# Patient Record
Sex: Male | Born: 1980 | Race: Black or African American | Hispanic: No | Smoking: Current every day smoker
Health system: Southern US, Community
[De-identification: ages and names within clinical notes are randomized; demographics above are authoritative.]

## PROBLEM LIST (undated history)

## (undated) DIAGNOSIS — F419 Anxiety disorder, unspecified: Secondary | ICD-10-CM

## (undated) DIAGNOSIS — R1032 Left lower quadrant pain: Secondary | ICD-10-CM

## (undated) DIAGNOSIS — M549 Dorsalgia, unspecified: Secondary | ICD-10-CM

## (undated) DIAGNOSIS — Z8781 Personal history of (healed) traumatic fracture: Secondary | ICD-10-CM

## (undated) DIAGNOSIS — J45909 Unspecified asthma, uncomplicated: Secondary | ICD-10-CM

## (undated) DIAGNOSIS — E119 Type 2 diabetes mellitus without complications: Secondary | ICD-10-CM

## (undated) DIAGNOSIS — M5442 Lumbago with sciatica, left side: Secondary | ICD-10-CM

## (undated) DIAGNOSIS — R2689 Other abnormalities of gait and mobility: Secondary | ICD-10-CM

## (undated) DIAGNOSIS — M79606 Pain in leg, unspecified: Secondary | ICD-10-CM

## (undated) DIAGNOSIS — F32A Depression, unspecified: Secondary | ICD-10-CM

## (undated) DIAGNOSIS — M1990 Unspecified osteoarthritis, unspecified site: Secondary | ICD-10-CM

## (undated) DIAGNOSIS — J349 Unspecified disorder of nose and nasal sinuses: Secondary | ICD-10-CM

## (undated) HISTORY — DX: Unspecified disorder of nose and nasal sinuses: J34.9

## (undated) HISTORY — DX: Pain in leg, unspecified: M79.606

## (undated) HISTORY — DX: Lumbago with sciatica, left side: M54.42

## (undated) HISTORY — DX: Anxiety disorder, unspecified: F41.9

## (undated) HISTORY — PX: HERNIA REPAIR: SHX51

## (undated) HISTORY — DX: Other abnormalities of gait and mobility: R26.89

## (undated) HISTORY — DX: Left lower quadrant pain: R10.32

## (undated) HISTORY — DX: Dorsalgia, unspecified: M54.9

## (undated) HISTORY — DX: Depression, unspecified: F32.A

## (undated) HISTORY — PX: ANTERIOR CRUCIATE LIGAMENT REPAIR: SHX115

## (undated) HISTORY — DX: Personal history of (healed) traumatic fracture: Z87.81

---

## 2019-04-18 DIAGNOSIS — Y990 Civilian activity done for income or pay: Secondary | ICD-10-CM

## 2019-04-18 HISTORY — DX: Civilian activity done for income or pay: Y99.0

## 2020-11-19 ENCOUNTER — Other Ambulatory Visit: Payer: Self-pay

## 2020-11-19 ENCOUNTER — Other Ambulatory Visit: Payer: Self-pay | Admitting: Neurological Surgery

## 2020-12-15 ENCOUNTER — Ambulatory Visit (INDEPENDENT_AMBULATORY_CARE_PROVIDER_SITE_OTHER): Payer: Worker's Compensation | Admitting: Vascular Surgery

## 2020-12-15 ENCOUNTER — Other Ambulatory Visit: Payer: Self-pay

## 2020-12-15 ENCOUNTER — Encounter: Payer: Self-pay | Admitting: Vascular Surgery

## 2020-12-15 VITALS — BP 138/96 | HR 73 | Temp 98.4°F | Resp 20 | Ht 69.0 in | Wt 245.0 lb

## 2020-12-15 DIAGNOSIS — M5136 Other intervertebral disc degeneration, lumbar region: Secondary | ICD-10-CM | POA: Diagnosis not present

## 2020-12-15 NOTE — Progress Notes (Signed)
 REASON FOR CONSULT:    To evaluate for anterior retroperitoneal exposure of L4-L5.  The consult is requested by Dr. Elsner.  ASSESSMENT & PLAN:   DEGENERATIVE DISC DISEASE L4-L5: This patient appears to be a good candidate for anterior retroperitoneal exposure of L4-L5.  I do not see any complicating factors on his CT or MRI.  I have reviewed our role in exposure of the spine in order to allow anterior lumbar interbody fusion at the appropriate levels. We have discussed the potential complications of surgery, including but not limited to, arterial or venous injury, thrombosis, or bleeding. We have also discussed the potential risks of wound healing problems, the development of a hernia, nerve injury, leg swelling, or other unpredictable medical problems. His BMI is 36 which does put him at slightly higher risk for complications.  All the patient's questions were answered and they are agreeable to proceed.  He is scheduled for surgery on 12/28/2020.  [name], MD Office: [phone]   HPI:   Clinton Horton is a pleasant 40 y.o. male, who was crushed by a forklift in 2020 and sustained multiple injuries including injuries to his back and pelvis.  He also had abdominal hernias which were repaired.  He has had continued problems with his back and has been evaluated by Dr. Elsner who felt he was a good candidate for anterior lumbar interbody fusion.  I have reviewed the records from Dr. Elsner's office.  The patient has evidence of spondylolisthesis at L4-L5 with central disc protrusion.  There was no nerve root compromise noted.  Has had pain for well over a year and a half and therefore it was felt that surgery was indicated.  The patient's risk factors for peripheral vascular disease include tobacco use.  He smokes half a pack per day.  He denies any history of diabetes, hypertension, hypercholesterolemia, or family history of premature cardiovascular disease.  Past Medical History:   Diagnosis Date  . Anxiety   . Back pain   . Balance disorder   . Depression   . Healed fracture    With Callus Formation  . Leg pain    While Walking  . LLQ pain   . Lumbago with sciatica, left side   . Sinus disorder   . Work related injury 04/18/2019    History reviewed. No pertinent family history.  SOCIAL HISTORY: Social History   Socioeconomic History  . Marital status: Divorced    Spouse name: Not on file  . Number of children: Not on file  . Years of education: Not on file  . Highest education level: Not on file  Occupational History  . Occupation: DISTRIBUTION  Tobacco Use  . Smoking status: Current Every Day Smoker    Packs/day: 0.50    Years: 19.00    Pack years: 9.50    Types: Cigarettes  . Smokeless tobacco: Never Used  Vaping Use  . Vaping Use: Never used  Substance and Sexual Activity  . Alcohol use: Yes    Comment: occasional  . Drug use: Not on file  . Sexual activity: Not on file  Other Topics Concern  . Not on file  Social History Narrative  . Not on file   Social Determinants of Health   Financial Resource Strain: Not on file  Food Insecurity: Not on file  Transportation Needs: Not on file  Physical Activity: Not on file  Stress: Not on file  Social Connections: Not on file  Intimate Partner Violence: Not on   file    Allergies  Allergen Reactions  . Other     Steroids cause elevated Blood Sugar    No current outpatient medications on file.   No current facility-administered medications for this visit.    REVIEW OF SYSTEMS:  [X]  denotes positive finding, [ ]  denotes negative finding Cardiac  Comments:  Chest pain or chest pressure:    Shortness of breath upon exertion:    Short of breath when lying flat:    Irregular heart rhythm:        Vascular    Pain in calf, thigh, or hip brought on by ambulation:    Pain in feet at night that wakes you up from your sleep:     Blood clot in your veins:    Leg swelling:          Pulmonary    Oxygen at home:    Productive cough:     Wheezing:         Neurologic    Sudden weakness in arms or legs:     Sudden numbness in arms or legs:     Sudden onset of difficulty speaking or slurred speech:    Temporary loss of vision in one eye:     Problems with dizziness:         Gastrointestinal    Blood in stool:     Vomited blood:         Genitourinary    Burning when urinating:     Blood in urine:        Psychiatric    Major depression:         Hematologic    Bleeding problems:    Problems with blood clotting too easily:        Skin    Rashes or ulcers:        Constitutional    Fever or chills:     PHYSICAL EXAM:   Vitals:   12/15/20 0901  BP: (!) 138/96  Pulse: 73  Resp: 20  Temp: 98.4 F (36.9 C)  SpO2: 97%  Weight: 245 lb (111.1 kg)  Height: 5\' 9"  (1.753 m)   Body mass index is 36.18 kg/m.  GENERAL: The patient is a well-nourished male, in no acute distress. The vital signs are documented above. CARDIAC: There is a regular rate and rhythm.  VASCULAR: I do not detect carotid bruits. He has palpable dorsalis pedis and posterior tibial positions bilaterally. PULMONARY: There is good air exchange bilaterally without wheezing or rales. ABDOMEN: Soft and non-tender with normal pitched bowel sounds.  MUSCULOSKELETAL: There are no major deformities or cyanosis. NEUROLOGIC: No focal weakness or paresthesias are detected. SKIN: There are no ulcers or rashes noted. PSYCHIATRIC: The patient has a normal affect.  DATA:    I did review his CT scan and MRI that were sent with the patient.  I do not see any complicating features from a vascular standpoint.

## 2020-12-15 NOTE — H&P (View-Only) (Signed)
REASON FOR CONSULT:    To evaluate for anterior retroperitoneal exposure of L4-L5.  The consult is requested by Dr. Danielle Dess.  ASSESSMENT & PLAN:   DEGENERATIVE DISC DISEASE L4-L5: This patient appears to be a good candidate for anterior retroperitoneal exposure of L4-L5.  I do not see any complicating factors on his CT or MRI.  I have reviewed our role in exposure of the spine in order to allow anterior lumbar interbody fusion at the appropriate levels. We have discussed the potential complications of surgery, including but not limited to, arterial or venous injury, thrombosis, or bleeding. We have also discussed the potential risks of wound healing problems, the development of a hernia, nerve injury, leg swelling, or other unpredictable medical problems. His BMI is 36 which does put him at slightly higher risk for complications.  All the patient's questions were answered and they are agreeable to proceed.  He is scheduled for surgery on 12/28/2020.  [name], MD Office: [phone]   HPI:   Clinton Horton is a pleasant 40 y.o. male, who was crushed by a forklift in 2020 and sustained multiple injuries including injuries to his back and pelvis.  He also had abdominal hernias which were repaired.  He has had continued problems with his back and has been evaluated by Dr. Danielle Dess who felt he was a good candidate for anterior lumbar interbody fusion.  I have reviewed the records from Dr. Verlee Rossetti office.  The patient has evidence of spondylolisthesis at L4-L5 with central disc protrusion.  There was no nerve root compromise noted.  Has had pain for well over a year and a half and therefore it was felt that surgery was indicated.  The patient's risk factors for peripheral vascular disease include tobacco use.  He smokes half a pack per day.  He denies any history of diabetes, hypertension, hypercholesterolemia, or family history of premature cardiovascular disease.  Past Medical History:   Diagnosis Date  . Anxiety   . Back pain   . Balance disorder   . Depression   . Healed fracture    With Callus Formation  . Leg pain    While Walking  . LLQ pain   . Lumbago with sciatica, left side   . Sinus disorder   . Work related injury 04/18/2019    History reviewed. No pertinent family history.  SOCIAL HISTORY: Social History   Socioeconomic History  . Marital status: Divorced    Spouse name: Not on file  . Number of children: Not on file  . Years of education: Not on file  . Highest education level: Not on file  Occupational History  . Occupation: DISTRIBUTION  Tobacco Use  . Smoking status: Current Every Day Smoker    Packs/day: 0.50    Years: 19.00    Pack years: 9.50    Types: Cigarettes  . Smokeless tobacco: Never Used  Vaping Use  . Vaping Use: Never used  Substance and Sexual Activity  . Alcohol use: Yes    Comment: occasional  . Drug use: Not on file  . Sexual activity: Not on file  Other Topics Concern  . Not on file  Social History Narrative  . Not on file   Social Determinants of Health   Financial Resource Strain: Not on file  Food Insecurity: Not on file  Transportation Needs: Not on file  Physical Activity: Not on file  Stress: Not on file  Social Connections: Not on file  Intimate Partner Violence: Not on  file    Allergies  Allergen Reactions  . Other     Steroids cause elevated Blood Sugar    No current outpatient medications on file.   No current facility-administered medications for this visit.    REVIEW OF SYSTEMS:  [X]  denotes positive finding, [ ]  denotes negative finding Cardiac  Comments:  Chest pain or chest pressure:    Shortness of breath upon exertion:    Short of breath when lying flat:    Irregular heart rhythm:        Vascular    Pain in calf, thigh, or hip brought on by ambulation:    Pain in feet at night that wakes you up from your sleep:     Blood clot in your veins:    Leg swelling:          Pulmonary    Oxygen at home:    Productive cough:     Wheezing:         Neurologic    Sudden weakness in arms or legs:     Sudden numbness in arms or legs:     Sudden onset of difficulty speaking or slurred speech:    Temporary loss of vision in one eye:     Problems with dizziness:         Gastrointestinal    Blood in stool:     Vomited blood:         Genitourinary    Burning when urinating:     Blood in urine:        Psychiatric    Major depression:         Hematologic    Bleeding problems:    Problems with blood clotting too easily:        Skin    Rashes or ulcers:        Constitutional    Fever or chills:     PHYSICAL EXAM:   Vitals:   12/15/20 0901  BP: (!) 138/96  Pulse: 73  Resp: 20  Temp: 98.4 F (36.9 C)  SpO2: 97%  Weight: 245 lb (111.1 kg)  Height: 5\' 9"  (1.753 m)   Body mass index is 36.18 kg/m.  GENERAL: The patient is a well-nourished male, in no acute distress. The vital signs are documented above. CARDIAC: There is a regular rate and rhythm.  VASCULAR: I do not detect carotid bruits. He has palpable dorsalis pedis and posterior tibial positions bilaterally. PULMONARY: There is good air exchange bilaterally without wheezing or rales. ABDOMEN: Soft and non-tender with normal pitched bowel sounds.  MUSCULOSKELETAL: There are no major deformities or cyanosis. NEUROLOGIC: No focal weakness or paresthesias are detected. SKIN: There are no ulcers or rashes noted. PSYCHIATRIC: The patient has a normal affect.  DATA:    I did review his CT scan and MRI that were sent with the patient.  I do not see any complicating features from a vascular standpoint.

## 2020-12-23 NOTE — Progress Notes (Signed)
Surgical Instructions    Your procedure is scheduled on Tuesday April 19th.  Report to [facility] Main Entrance "A" at 5:30 A.M., then check in with the Admitting office.  Call this number if you have problems the morning of surgery:  [phone]   If you have any questions prior to your surgery date call [phone]: Open Monday-Friday 8am-4pm    Remember:  Do not eat or drink after midnight the night before your surgery     There are no medications you need to take the morning of surgery.  As of today, STOP taking any Aspirin (unless otherwise instructed by your surgeon) Aleve, Naproxen, Ibuprofen, Motrin, Advil, Goody's, BC's, all herbal medications, fish oil, and all vitamins.                     Do not wear jewelry.            Do not wear lotions, powders,colognes, or deodorant.            Do not shave 48 hours prior to surgery.  Men may shave face and neck.            Do not bring valuables to the hospital.            [facility] is not responsible for any belongings or valuables.  Do NOT Smoke (Tobacco/Vaping) or drink Alcohol 24 hours prior to your procedure If you use a CPAP at night, you may bring all equipment for your overnight stay.   Contacts, glasses, dentures or partials may not be worn into surgery, please bring cases for these belongings   For patients admitted to the hospital, discharge time will be determined by your treatment team.   Patients discharged the day of surgery will not be allowed to drive home, and someone needs to stay with them for 24 hours.    Special instructions:   [facility]- Preparing For Surgery  Before surgery, you can play an important role. Because skin is not sterile, your skin needs to be as free of germs as possible. You can reduce the number of germs on your skin by washing with CHG (chlorahexidine gluconate) Soap before surgery.  CHG is an antiseptic cleaner which kills germs and bonds with the skin to continue killing germs  even after washing.    Oral Hygiene is also important to reduce your risk of infection.  Remember - BRUSH YOUR TEETH THE MORNING OF SURGERY WITH YOUR REGULAR TOOTHPASTE  Please do not use if you have an allergy to CHG or antibacterial soaps. If your skin becomes reddened/irritated stop using the CHG.  Do not shave (including legs and underarms) for at least 48 hours prior to first CHG shower. It is OK to shave your face.  Please follow these instructions carefully.   1. Shower the NIGHT BEFORE SURGERY and the MORNING OF SURGERY  2. If you chose to wash your hair, wash your hair first as usual with your normal shampoo.  3. After you shampoo, rinse your hair and body thoroughly to remove the shampoo.   4. Use CHG Soap as you would any other liquid soap. You can apply CHG directly to the skin and wash gently with a scrungie or a clean washcloth.   5. Apply the CHG Soap to your body ONLY FROM THE NECK DOWN.  Do not use on open wounds or open sores. Avoid contact with your eyes, ears, mouth and genitals (private parts). Wash Face and  genitals (private parts)  with your normal soap.   6. Wash thoroughly, paying special attention to the area where your surgery will be performed.  7. Thoroughly rinse your body with warm water from the neck down.  8. DO NOT shower/wash with your normal soap after using and rinsing off the CHG Soap.  9. Pat yourself dry with a CLEAN TOWEL.  10. Wear CLEAN PAJAMAS to bed the night before surgery  11. Place CLEAN SHEETS on your bed the night before your surgery  12. DO NOT SLEEP WITH PETS.   Day of Surgery: Take a shower with CHG soap. Wear Clean/Comfortable clothing the morning of surgery Do not apply any deodorants/lotions.   Remember to brush your teeth WITH YOUR REGULAR TOOTHPASTE.   Please read over the following fact sheets that you were given.

## 2020-12-24 ENCOUNTER — Encounter (HOSPITAL_COMMUNITY)
Admission: RE | Admit: 2020-12-24 | Discharge: 2020-12-24 | Disposition: A | Discharge status: Home / Self Care | Payer: No Typology Code available for payment source | Source: Ambulatory Visit | Attending: Neurological Surgery | Admitting: Neurological Surgery

## 2020-12-24 ENCOUNTER — Encounter (HOSPITAL_COMMUNITY): Payer: Self-pay

## 2020-12-24 ENCOUNTER — Other Ambulatory Visit: Payer: Self-pay

## 2020-12-24 DIAGNOSIS — Z01812 Encounter for preprocedural laboratory examination: Secondary | ICD-10-CM | POA: Diagnosis not present

## 2020-12-24 HISTORY — DX: Unspecified asthma, uncomplicated: J45.909

## 2020-12-24 HISTORY — DX: Type 2 diabetes mellitus without complications: E11.9

## 2020-12-24 HISTORY — DX: Unspecified osteoarthritis, unspecified site: M19.90

## 2020-12-24 LAB — BASIC METABOLIC PANEL
Anion gap: 6 (ref 5–15)
BUN: 8 mg/dL (ref 6–20)
CO2: 28 mmol/L (ref 22–32)
Calcium: 9.7 mg/dL (ref 8.9–10.3)
Chloride: 101 mmol/L (ref 98–111)
Creatinine, Ser: 0.87 mg/dL (ref 0.61–1.24)
GFR, Estimated: >60 mL/min (ref >60)
Glucose, Bld: 131 mg/dL — ABNORMAL HIGH (ref 70–99)
Potassium: 3.9 mmol/L (ref 3.5–5.1)
Sodium: 135 mmol/L (ref 135–145)

## 2020-12-24 LAB — CBC
HCT: 45.9 % (ref 39.0–52.0)
Hemoglobin: 15.4 g/dL (ref 13.0–17.0)
MCH: 29.1 pg (ref 26.0–34.0)
MCHC: 33.6 g/dL (ref 30.0–36.0)
MCV: 86.8 fL (ref 80.0–100.0)
Platelets: 247 10*3/uL (ref 150–400)
RBC: 5.29 MIL/uL (ref 4.22–5.81)
RDW: 13.4 % (ref 11.5–15.5)
WBC: 4.4 10*3/uL (ref 4.0–10.5)
nRBC: 0 % (ref 0.0–0.2)

## 2020-12-24 LAB — TYPE AND SCREEN
ABO/RH(D): A POS
Antibody Screen: NEGATIVE

## 2020-12-24 LAB — SURGICAL PCR SCREEN
MRSA, PCR: NEGATIVE
Staphylococcus aureus: NEGATIVE

## 2020-12-24 LAB — HEMOGLOBIN A1C
Hgb A1c MFr Bld: 6.7 % — ABNORMAL HIGH (ref 4.8–5.6)
Mean Plasma Glucose: 145.59 mg/dL

## 2020-12-24 LAB — GLUCOSE, CAPILLARY: Glucose-Capillary: 148 mg/dL — ABNORMAL HIGH (ref 70–99)

## 2020-12-24 NOTE — Progress Notes (Signed)
PCP: Denies Cardiologist: Denies  EKG: 06/26/21. Requested EKG tracing CXR: na ECHO: denies Stress Test: denies Cardiac Cath: denies  Fasting Blood Sugar- patient denies DM Checks Blood Sugar__0_ times a day  OSA/CPAP: No  ASA/Blood Thinners: No  covid test 12/24/20 at PAT  Anesthesia Review: No  Patient denies shortness of breath, fever, cough, and chest pain at PAT appointment.  Patient verbalized understanding of instructions provided today at the PAT appointment.  Patient asked to review instructions at home and day of surgery.

## 2020-12-27 NOTE — Anesthesia Preprocedure Evaluation (Addendum)
Anesthesia Evaluation  Patient identified by MRN, date of birth, ID band Patient awake    Reviewed: Allergy & Precautions, NPO status , Patient's Chart, lab work & pertinent test results  Airway Mallampati: II  TM Distance: >3 FB     Dental   Pulmonary asthma , Current Smoker and Patient abstained from smoking.,    breath sounds clear to auscultation       Cardiovascular negative cardio ROS   Rhythm:Regular Rate:Normal     Neuro/Psych PSYCHIATRIC DISORDERS Anxiety Depression  Neuromuscular disease    GI/Hepatic negative GI ROS, Neg liver ROS,   Endo/Other  diabetes  Renal/GU negative Renal ROS     Musculoskeletal   Abdominal   Peds  Hematology   Anesthesia Other Findings   Reproductive/Obstetrics                            Anesthesia Physical Anesthesia Plan  ASA: III  Anesthesia Plan: General   Post-op Pain Management:    Induction: Intravenous  PONV Risk Score and Plan: 2 and Ondansetron, Dexamethasone and Midazolam  Airway Management Planned: Oral ETT  Additional Equipment: Arterial line  Intra-op Plan:   Post-operative Plan: Possible Post-op intubation/ventilation  Informed Consent: I have reviewed the patients History and Physical, chart, labs and discussed the procedure including the risks, benefits and alternatives for the proposed anesthesia with the patient or authorized representative who has indicated his/her understanding and acceptance.     Dental advisory given  Plan Discussed with: Anesthesiologist and CRNA  Anesthesia Plan Comments:       Anesthesia Quick Evaluation

## 2020-12-28 ENCOUNTER — Other Ambulatory Visit: Payer: Self-pay

## 2020-12-28 ENCOUNTER — Inpatient Hospital Stay (HOSPITAL_COMMUNITY): Payer: No Typology Code available for payment source

## 2020-12-28 ENCOUNTER — Encounter (HOSPITAL_COMMUNITY): Payer: Self-pay | Admitting: Neurological Surgery

## 2020-12-28 ENCOUNTER — Inpatient Hospital Stay (HOSPITAL_COMMUNITY): Payer: No Typology Code available for payment source | Admitting: Vascular Surgery

## 2020-12-28 ENCOUNTER — Encounter (HOSPITAL_COMMUNITY)
Admission: RE | Disposition: A | Discharge status: Home / Self Care | Payer: Self-pay | Source: Home / Self Care | Attending: Neurological Surgery

## 2020-12-28 ENCOUNTER — Inpatient Hospital Stay (HOSPITAL_COMMUNITY)
Admission: RE | Admit: 2020-12-28 | Discharge: 2020-12-29 | DRG: 460 | Disposition: A | Discharge status: Home / Self Care | Payer: No Typology Code available for payment source | Attending: Neurological Surgery | Admitting: Neurological Surgery

## 2020-12-28 ENCOUNTER — Inpatient Hospital Stay (HOSPITAL_COMMUNITY): Payer: No Typology Code available for payment source | Admitting: Certified Registered"

## 2020-12-28 DIAGNOSIS — M4316 Spondylolisthesis, lumbar region: Secondary | ICD-10-CM | POA: Diagnosis present

## 2020-12-28 DIAGNOSIS — Z20822 Contact with and (suspected) exposure to covid-19: Secondary | ICD-10-CM | POA: Diagnosis present

## 2020-12-28 DIAGNOSIS — F419 Anxiety disorder, unspecified: Secondary | ICD-10-CM | POA: Diagnosis present

## 2020-12-28 DIAGNOSIS — Z419 Encounter for procedure for purposes other than remedying health state, unspecified: Secondary | ICD-10-CM

## 2020-12-28 DIAGNOSIS — E119 Type 2 diabetes mellitus without complications: Secondary | ICD-10-CM | POA: Diagnosis present

## 2020-12-28 DIAGNOSIS — F32A Depression, unspecified: Secondary | ICD-10-CM | POA: Diagnosis present

## 2020-12-28 DIAGNOSIS — F17210 Nicotine dependence, cigarettes, uncomplicated: Secondary | ICD-10-CM | POA: Diagnosis present

## 2020-12-28 DIAGNOSIS — Z7984 Long term (current) use of oral hypoglycemic drugs: Secondary | ICD-10-CM | POA: Diagnosis not present

## 2020-12-28 DIAGNOSIS — M5416 Radiculopathy, lumbar region: Secondary | ICD-10-CM | POA: Diagnosis not present

## 2020-12-28 DIAGNOSIS — M5116 Intervertebral disc disorders with radiculopathy, lumbar region: Secondary | ICD-10-CM | POA: Diagnosis present

## 2020-12-28 DIAGNOSIS — Z888 Allergy status to other drugs, medicaments and biological substances status: Secondary | ICD-10-CM | POA: Diagnosis not present

## 2020-12-28 HISTORY — PX: ANTERIOR LUMBAR FUSION: SHX1170

## 2020-12-28 HISTORY — PX: ABDOMINAL EXPOSURE: SHX5708

## 2020-12-28 LAB — GLUCOSE, CAPILLARY
Glucose-Capillary: 110 mg/dL — ABNORMAL HIGH (ref 70–99)
Glucose-Capillary: 130 mg/dL — ABNORMAL HIGH (ref 70–99)
Glucose-Capillary: 149 mg/dL — ABNORMAL HIGH (ref 70–99)
Glucose-Capillary: 106 mg/dL — ABNORMAL HIGH (ref 70–99)

## 2020-12-28 LAB — ABO/RH: ABO/RH(D): A POS

## 2020-12-28 LAB — SARS CORONAVIRUS 2 BY RT PCR (HOSPITAL ORDER, PERFORMED IN ~~LOC~~ HOSPITAL LAB): SARS Coronavirus 2: NEGATIVE

## 2020-12-28 SURGERY — ANTERIOR LUMBAR FUSION 1 LEVEL
Anesthesia: General

## 2020-12-28 MED ORDER — SUGAMMADEX SODIUM 200 MG/2ML IV SOLN
INTRAVENOUS | Status: DC | PRN
  Administered 2020-12-28: 300 mg via INTRAVENOUS

## 2020-12-28 MED ORDER — ROCURONIUM BROMIDE 10 MG/ML (PF) SYRINGE
PREFILLED_SYRINGE | INTRAVENOUS | Status: AC
  Filled 2020-12-28: qty 10

## 2020-12-28 MED ORDER — FENTANYL CITRATE (PF) 100 MCG/2ML IJ SOLN: 25.0000 ug | INTRAMUSCULAR | Status: DC | PRN

## 2020-12-28 MED ORDER — PROPOFOL 10 MG/ML IV BOLUS
INTRAVENOUS | Status: DC | PRN
  Administered 2020-12-28: 150 mg via INTRAVENOUS

## 2020-12-28 MED ORDER — ONDANSETRON HCL 4 MG PO TABS: 4.0000 mg | ORAL_TABLET | Freq: Four times a day (QID) | ORAL | Status: DC | PRN

## 2020-12-28 MED ORDER — MIDAZOLAM HCL 5 MG/5ML IJ SOLN
INTRAMUSCULAR | Status: DC | PRN
  Administered 2020-12-28: 2 mg via INTRAVENOUS

## 2020-12-28 MED ORDER — LACTATED RINGERS IV SOLN: INTRAVENOUS | Status: DC

## 2020-12-28 MED ORDER — SODIUM CHLORIDE 0.9% FLUSH: 3.0000 mL | INTRAVENOUS | Status: DC | PRN

## 2020-12-28 MED ORDER — LIDOCAINE-EPINEPHRINE 1 %-1:100000 IJ SOLN
INTRAMUSCULAR | Status: AC
  Filled 2020-12-28: qty 1

## 2020-12-28 MED ORDER — SENNA 8.6 MG PO TABS
1.0000 | ORAL_TABLET | Freq: Two times a day (BID) | ORAL | Status: DC
  Administered 2020-12-28 (×2): 8.6 mg via ORAL
  Filled 2020-12-28 (×2): qty 1

## 2020-12-28 MED ORDER — DOCUSATE SODIUM 100 MG PO CAPS
100.0000 mg | ORAL_CAPSULE | Freq: Two times a day (BID) | ORAL | Status: DC
  Administered 2020-12-28 (×2): 100 mg via ORAL
  Filled 2020-12-28 (×2): qty 1

## 2020-12-28 MED ORDER — MENTHOL 3 MG MT LOZG: 1.0000 | LOZENGE | OROMUCOSAL | Status: DC | PRN

## 2020-12-28 MED ORDER — METHOCARBAMOL 1000 MG/10ML IJ SOLN
500.0000 mg | Freq: Four times a day (QID) | INTRAVENOUS | Status: DC | PRN
  Filled 2020-12-28: qty 5

## 2020-12-28 MED ORDER — SUCCINYLCHOLINE CHLORIDE 200 MG/10ML IV SOSY
PREFILLED_SYRINGE | INTRAVENOUS | Status: AC
  Filled 2020-12-28: qty 10

## 2020-12-28 MED ORDER — LIDOCAINE 2% (20 MG/ML) 5 ML SYRINGE
INTRAMUSCULAR | Status: AC
  Filled 2020-12-28: qty 5

## 2020-12-28 MED ORDER — CHLORHEXIDINE GLUCONATE CLOTH 2 % EX PADS: 6.0000 | MEDICATED_PAD | Freq: Once | CUTANEOUS | Status: DC

## 2020-12-28 MED ORDER — CEFAZOLIN SODIUM-DEXTROSE 2-4 GM/100ML-% IV SOLN
2.0000 g | INTRAVENOUS | Status: AC
  Administered 2020-12-28: 2 g via INTRAVENOUS
  Filled 2020-12-28: qty 100

## 2020-12-28 MED ORDER — FENTANYL CITRATE (PF) 250 MCG/5ML IJ SOLN
INTRAMUSCULAR | Status: AC
  Filled 2020-12-28: qty 5

## 2020-12-28 MED ORDER — PHENYLEPHRINE 40 MCG/ML (10ML) SYRINGE FOR IV PUSH (FOR BLOOD PRESSURE SUPPORT)
PREFILLED_SYRINGE | INTRAVENOUS | Status: AC
  Filled 2020-12-28: qty 10

## 2020-12-28 MED ORDER — ADULT MULTIVITAMIN W/MINERALS CH
1.0000 | ORAL_TABLET | Freq: Every day | ORAL | Status: DC
  Administered 2020-12-28: 1 via ORAL
  Filled 2020-12-28: qty 1

## 2020-12-28 MED ORDER — METHOCARBAMOL 500 MG PO TABS
500.0000 mg | ORAL_TABLET | Freq: Four times a day (QID) | ORAL | Status: DC | PRN
  Administered 2020-12-28 – 2020-12-29 (×3): 500 mg via ORAL
  Filled 2020-12-28 (×3): qty 1

## 2020-12-28 MED ORDER — THROMBIN 20000 UNITS EX SOLR
CUTANEOUS | Status: DC | PRN
  Administered 2020-12-28: 20 mL via TOPICAL

## 2020-12-28 MED ORDER — THROMBIN 5000 UNITS EX SOLR
CUTANEOUS | Status: AC
  Filled 2020-12-28: qty 5000

## 2020-12-28 MED ORDER — LIDOCAINE 2% (20 MG/ML) 5 ML SYRINGE
INTRAMUSCULAR | Status: DC | PRN
  Administered 2020-12-28: 80 mg via INTRAVENOUS

## 2020-12-28 MED ORDER — ACETAMINOPHEN 325 MG PO TABS: 650.0000 mg | ORAL_TABLET | ORAL | Status: DC | PRN

## 2020-12-28 MED ORDER — PHENOL 1.4 % MT LIQD: 1.0000 | OROMUCOSAL | Status: DC | PRN

## 2020-12-28 MED ORDER — PHENYLEPHRINE HCL-NACL 10-0.9 MG/250ML-% IV SOLN
INTRAVENOUS | Status: DC | PRN
  Administered 2020-12-28: 20 ug/min via INTRAVENOUS

## 2020-12-28 MED ORDER — SODIUM CHLORIDE 0.9 % IV SOLN: 250.0000 mL | INTRAVENOUS | Status: DC

## 2020-12-28 MED ORDER — ROCURONIUM BROMIDE 10 MG/ML (PF) SYRINGE
PREFILLED_SYRINGE | INTRAVENOUS | Status: DC | PRN
  Administered 2020-12-28: 20 mg via INTRAVENOUS
  Administered 2020-12-28: 60 mg via INTRAVENOUS
  Administered 2020-12-28: 40 mg via INTRAVENOUS
  Administered 2020-12-28: 20 mg via INTRAVENOUS

## 2020-12-28 MED ORDER — BISACODYL 10 MG RE SUPP: 10.0000 mg | Freq: Every day | RECTAL | Status: DC | PRN

## 2020-12-28 MED ORDER — ORAL CARE MOUTH RINSE: 15.0000 mL | Freq: Once | OROMUCOSAL | Status: AC

## 2020-12-28 MED ORDER — INSULIN ASPART 100 UNIT/ML ~~LOC~~ SOLN
0.0000 [IU] | Freq: Three times a day (TID) | SUBCUTANEOUS | Status: DC
  Administered 2020-12-28 – 2020-12-29 (×2): 2 [IU] via SUBCUTANEOUS

## 2020-12-28 MED ORDER — CHLORHEXIDINE GLUCONATE 0.12 % MT SOLN
15.0000 mL | Freq: Once | OROMUCOSAL | Status: AC
  Administered 2020-12-28: 15 mL via OROMUCOSAL
  Filled 2020-12-28: qty 15

## 2020-12-28 MED ORDER — ALUM & MAG HYDROXIDE-SIMETH 200-200-20 MG/5ML PO SUSP: 30.0000 mL | Freq: Four times a day (QID) | ORAL | Status: DC | PRN

## 2020-12-28 MED ORDER — CHLORHEXIDINE GLUCONATE 4 % EX LIQD: 60.0000 mL | Freq: Once | CUTANEOUS | Status: DC

## 2020-12-28 MED ORDER — DEXMEDETOMIDINE HCL 200 MCG/2ML IV SOLN
INTRAVENOUS | Status: DC | PRN
  Administered 2020-12-28: 8 ug via INTRAVENOUS

## 2020-12-28 MED ORDER — ACETAMINOPHEN 650 MG RE SUPP: 650.0000 mg | RECTAL | Status: DC | PRN

## 2020-12-28 MED ORDER — CEFAZOLIN SODIUM-DEXTROSE 2-4 GM/100ML-% IV SOLN
2.0000 g | Freq: Three times a day (TID) | INTRAVENOUS | Status: AC
  Administered 2020-12-28 (×2): 2 g via INTRAVENOUS
  Filled 2020-12-28 (×2): qty 100

## 2020-12-28 MED ORDER — FLEET ENEMA 7-19 GM/118ML RE ENEM: 1.0000 | ENEMA | Freq: Once | RECTAL | Status: DC | PRN

## 2020-12-28 MED ORDER — SUCCINYLCHOLINE CHLORIDE 20 MG/ML IJ SOLN
INTRAMUSCULAR | Status: DC | PRN
  Administered 2020-12-28: 140 mg via INTRAVENOUS

## 2020-12-28 MED ORDER — DEXAMETHASONE SODIUM PHOSPHATE 10 MG/ML IJ SOLN
INTRAMUSCULAR | Status: DC | PRN
  Administered 2020-12-28: 5 mg via INTRAVENOUS

## 2020-12-28 MED ORDER — BUPIVACAINE HCL (PF) 0.5 % IJ SOLN
INTRAMUSCULAR | Status: DC | PRN
  Administered 2020-12-28: 25 mL

## 2020-12-28 MED ORDER — PROPOFOL 10 MG/ML IV BOLUS
INTRAVENOUS | Status: AC
  Filled 2020-12-28: qty 20

## 2020-12-28 MED ORDER — 0.9 % SODIUM CHLORIDE (POUR BTL) OPTIME
TOPICAL | Status: DC | PRN
  Administered 2020-12-28: 1000 mL

## 2020-12-28 MED ORDER — METFORMIN HCL 500 MG PO TABS
500.0000 mg | ORAL_TABLET | Freq: Two times a day (BID) | ORAL | Status: DC
  Administered 2020-12-28 – 2020-12-29 (×2): 500 mg via ORAL
  Filled 2020-12-28 (×2): qty 1

## 2020-12-28 MED ORDER — ONDANSETRON HCL 4 MG/2ML IJ SOLN
INTRAMUSCULAR | Status: DC | PRN
  Administered 2020-12-28: 4 mg via INTRAVENOUS

## 2020-12-28 MED ORDER — ONDANSETRON HCL 4 MG/2ML IJ SOLN: 4.0000 mg | Freq: Four times a day (QID) | INTRAMUSCULAR | Status: DC | PRN

## 2020-12-28 MED ORDER — OXYCODONE-ACETAMINOPHEN 5-325 MG PO TABS
1.0000 | ORAL_TABLET | ORAL | Status: DC | PRN
  Administered 2020-12-28 – 2020-12-29 (×6): 2 via ORAL
  Filled 2020-12-28: qty 1
  Filled 2020-12-28: qty 2
  Filled 2020-12-28: qty 1
  Filled 2020-12-28 (×4): qty 2

## 2020-12-28 MED ORDER — MORPHINE SULFATE (PF) 2 MG/ML IV SOLN: 2.0000 mg | INTRAVENOUS | Status: DC | PRN

## 2020-12-28 MED ORDER — THROMBIN 20000 UNITS EX KIT
PACK | CUTANEOUS | Status: AC
  Filled 2020-12-28: qty 1

## 2020-12-28 MED ORDER — POLYETHYLENE GLYCOL 3350 17 G PO PACK: 17.0000 g | PACK | Freq: Every day | ORAL | Status: DC | PRN

## 2020-12-28 MED ORDER — INSULIN ASPART 100 UNIT/ML ~~LOC~~ SOLN: 0.0000 [IU] | Freq: Every day | SUBCUTANEOUS | Status: DC

## 2020-12-28 MED ORDER — SODIUM CHLORIDE 0.9% FLUSH
3.0000 mL | Freq: Two times a day (BID) | INTRAVENOUS | Status: DC
  Administered 2020-12-28: 3 mL via INTRAVENOUS

## 2020-12-28 MED ORDER — THROMBIN 5000 UNITS EX SOLR
OROMUCOSAL | Status: DC | PRN
  Administered 2020-12-28: 5 mL via TOPICAL

## 2020-12-28 MED ORDER — FENTANYL CITRATE (PF) 100 MCG/2ML IJ SOLN
INTRAMUSCULAR | Status: DC | PRN
  Administered 2020-12-28: 50 ug via INTRAVENOUS
  Administered 2020-12-28: 150 ug via INTRAVENOUS
  Administered 2020-12-28 (×2): 50 ug via INTRAVENOUS

## 2020-12-28 MED ORDER — MIDAZOLAM HCL 2 MG/2ML IJ SOLN
INTRAMUSCULAR | Status: AC
  Filled 2020-12-28: qty 2

## 2020-12-28 MED ORDER — BUPIVACAINE HCL (PF) 0.5 % IJ SOLN
INTRAMUSCULAR | Status: AC
  Filled 2020-12-28: qty 30

## 2020-12-28 SURGICAL SUPPLY — 83 items
APPLIER CLIP 11 MED OPEN (CLIP) ×2
BASKET BONE COLLECTION (BASKET) IMPLANT
BOLT ALIF MODULUS 5X20 (Bolt) ×8 IMPLANT
BONE CANC CHIPS 20CC PCAN1/4 (Bone Implant) ×2 IMPLANT
BUR BARREL STRAIGHT FLUTE 4.0 (BURR) ×2 IMPLANT
BUR MATCHSTICK NEURO 3.0 LAGG (BURR) ×2 IMPLANT
CANISTER SUCT 3000ML PPV (MISCELLANEOUS) ×2 IMPLANT
CHIPS CANC BONE 20CC PCAN1/4 (Bone Implant) ×1 IMPLANT
CLIP APPLIE 11 MED OPEN (CLIP) ×1 IMPLANT
COVER BACK TABLE 60X90IN (DRAPES) ×4 IMPLANT
COVER WAND RF STERILE (DRAPES) ×2 IMPLANT
DECANTER SPIKE VIAL GLASS SM (MISCELLANEOUS) ×2 IMPLANT
DERMABOND ADVANCED (GAUZE/BANDAGES/DRESSINGS) ×1
DERMABOND ADVANCED .7 DNX12 (GAUZE/BANDAGES/DRESSINGS) ×1 IMPLANT
DRAPE C-ARM 42X72 X-RAY (DRAPES) ×4 IMPLANT
DRAPE C-ARMOR (DRAPES) ×2 IMPLANT
DRAPE INCISE IOBAN 66X45 STRL (DRAPES) ×2 IMPLANT
DRAPE LAPAROTOMY 100X72X124 (DRAPES) ×2 IMPLANT
DURAPREP 26ML APPLICATOR (WOUND CARE) ×2 IMPLANT
ELECT BLADE 4.0 EZ CLEAN MEGAD (MISCELLANEOUS) ×2
ELECT REM PT RETURN 9FT ADLT (ELECTROSURGICAL) ×2
ELECTRODE BLDE 4.0 EZ CLN MEGD (MISCELLANEOUS) ×1 IMPLANT
ELECTRODE REM PT RTRN 9FT ADLT (ELECTROSURGICAL) ×1 IMPLANT
GAUZE 4X4 16PLY RFD (DISPOSABLE) IMPLANT
GLOVE BIOGEL PI IND STRL 8.5 (GLOVE) ×2 IMPLANT
GLOVE BIOGEL PI INDICATOR 8.5 (GLOVE) ×2
GLOVE ECLIPSE 7.5 STRL STRAW (GLOVE) ×2 IMPLANT
GLOVE ECLIPSE 8.5 STRL (GLOVE) ×2 IMPLANT
GLOVE SRG 8 PF TXTR STRL LF DI (GLOVE) ×2 IMPLANT
GLOVE SURG UNDER POLY LF SZ6.5 (GLOVE) ×2 IMPLANT
GLOVE SURG UNDER POLY LF SZ7.5 (GLOVE) ×2 IMPLANT
GLOVE SURG UNDER POLY LF SZ8 (GLOVE) ×2
GOWN STRL REUS W/ TWL LRG LVL3 (GOWN DISPOSABLE) ×2 IMPLANT
GOWN STRL REUS W/ TWL XL LVL3 (GOWN DISPOSABLE) ×1 IMPLANT
GOWN STRL REUS W/TWL 2XL LVL3 (GOWN DISPOSABLE) ×2 IMPLANT
GOWN STRL REUS W/TWL LRG LVL3 (GOWN DISPOSABLE) ×2
GOWN STRL REUS W/TWL XL LVL3 (GOWN DISPOSABLE) ×1
GRAFT BONE PROTEIOS SM 1CC (Orthopedic Implant) ×2 IMPLANT
HEMOSTAT POWDER KIT SURGIFOAM (HEMOSTASIS) ×2 IMPLANT
INSERT FOGARTY 61MM (MISCELLANEOUS) IMPLANT
INSERT FOGARTY SM (MISCELLANEOUS) IMPLANT
KIT BASIN OR (CUSTOM PROCEDURE TRAY) ×2 IMPLANT
KIT TURNOVER KIT B (KITS) ×2 IMPLANT
LOOP VESSEL MAXI BLUE (MISCELLANEOUS) IMPLANT
LOOP VESSEL MINI RED (MISCELLANEOUS) IMPLANT
NEEDLE HYPO 25X1 1.5 SAFETY (NEEDLE) IMPLANT
NEEDLE SPNL 18GX3.5 QUINCKE PK (NEEDLE) IMPLANT
NS IRRIG 1000ML POUR BTL (IV SOLUTION) ×2 IMPLANT
PACK LAMINECTOMY NEURO (CUSTOM PROCEDURE TRAY) ×2 IMPLANT
PAD ARMBOARD 7.5X6 YLW CONV (MISCELLANEOUS) ×4 IMPLANT
PUTTY DBX 5CC (Putty) ×2 IMPLANT
SPACER ALIF MOD 8X38X28 15D (Spacer) ×2 IMPLANT
SPONGE INTESTINAL PEANUT (DISPOSABLE) ×4 IMPLANT
SPONGE LAP 18X18 RF (DISPOSABLE) ×2 IMPLANT
SPONGE LAP 4X18 RFD (DISPOSABLE) IMPLANT
SPONGE SURGIFOAM ABS GEL 100 (HEMOSTASIS) ×2 IMPLANT
STAPLER VISISTAT (STAPLE) IMPLANT
SUT PDS AB 1 CTX 36 (SUTURE) ×2 IMPLANT
SUT PROLENE 4 0 RB 1 (SUTURE)
SUT PROLENE 4-0 RB1 .5 CRCL 36 (SUTURE) IMPLANT
SUT PROLENE 5 0 CC1 (SUTURE) IMPLANT
SUT PROLENE 6 0 C 1 30 (SUTURE) ×2 IMPLANT
SUT PROLENE 6 0 CC (SUTURE) IMPLANT
SUT SILK 0 TIES 10X30 (SUTURE) ×2 IMPLANT
SUT SILK 2 0 TIES 10X30 (SUTURE) ×4 IMPLANT
SUT SILK 2 0SH CR/8 30 (SUTURE) IMPLANT
SUT SILK 3 0 TIES 10X30 (SUTURE) ×2 IMPLANT
SUT SILK 3 0SH CR/8 30 (SUTURE) IMPLANT
SUT VIC AB 0 CT1 27 (SUTURE) ×1
SUT VIC AB 0 CT1 27XBRD ANBCTR (SUTURE) ×1 IMPLANT
SUT VIC AB 1 CT1 18XBRD ANBCTR (SUTURE) ×1 IMPLANT
SUT VIC AB 1 CT1 8-18 (SUTURE) ×1
SUT VIC AB 2-0 CP2 18 (SUTURE) ×2 IMPLANT
SUT VIC AB 2-0 CTB1 (SUTURE) ×2 IMPLANT
SUT VIC AB 3-0 SH 27 (SUTURE) ×1
SUT VIC AB 3-0 SH 27X BRD (SUTURE) ×1 IMPLANT
SUT VIC AB 3-0 SH 8-18 (SUTURE) ×2 IMPLANT
SUT VIC AB 4-0 RB1 18 (SUTURE) ×4 IMPLANT
SUT VICRYL 4-0 PS2 18IN ABS (SUTURE) IMPLANT
TOWEL GREEN STERILE (TOWEL DISPOSABLE) ×4 IMPLANT
TOWEL GREEN STERILE FF (TOWEL DISPOSABLE) ×6 IMPLANT
TRAY FOLEY MTR SLVR 16FR STAT (SET/KITS/TRAYS/PACK) ×2 IMPLANT
WATER STERILE IRR 1000ML POUR (IV SOLUTION) ×2 IMPLANT

## 2020-12-28 NOTE — Anesthesia Procedure Notes (Signed)
Procedure Name: Intubation Date/Time: 12/28/2020 8:04 AM Performed by: Lavell Luster, CRNA Pre-anesthesia Checklist: Patient identified, Emergency Drugs available, Suction available, Patient being monitored and Timeout performed Patient Re-evaluated:Patient Re-evaluated prior to induction Oxygen Delivery Method: Circle system utilized Preoxygenation: Pre-oxygenation with 100% oxygen Induction Type: IV induction Ventilation: Mask ventilation without difficulty Laryngoscope Size: Mac and 4 Grade View: Grade II Tube type: Oral Tube size: 7.5 mm Number of attempts: 1 Airway Equipment and Method: Stylet Placement Confirmation: ETT inserted through vocal cords under direct vision,  positive ETCO2 and breath sounds checked- equal and bilateral Secured at: 22 cm Tube secured with: Tape Dental Injury: Teeth and Oropharynx as per pre-operative assessment

## 2020-12-28 NOTE — H&P (Signed)
CHIEF COMPLAINT: Circumferential pelvic and low back pain.  HISTORY OF PRESENT ILLNESS: Mr. Clinton Horton is a 40 year old right-handed individual, who tells me that on April 28, 2019, he had an injury where he was crushed between a forklift and a beam that was hanging from the wall.  He notes that he sustained a pelvic injury with an acetabular fracture, in addition to a hernia.  He has had hernia repair done, but he has had chronic back pain that he notes radiates from the area of the anterior superior iliac spines posteriorly nearly to the midline of the back and then into the lower extremities.  This has been a persistent problem, and he has been treated extensively with 2 rounds of physical therapy, in addition to prolotherapy to the sacroiliac joints.  He was given some steroid injections and this landed him in the hospital for a period of about 5 days as he found that he was diabetic and in diabetic ketoacidosis.  He notes that since he has been off the steroids, his sugars are completely under control, and he does not seem to have any ill feeling.  He is only taking metformin for control of his diabetes.  Nonetheless, the pain continues to be a problem, now nearly a year and a half from the time of his initial injury.  He presents with an MRI of the lumbar spine that was completed on November 9 of this past year, and that study demonstrates that he appears to have a spondylolisthesis at the level of L4-L5.  There is a central disc protrusion at that level. Motion films have not been performed, and today in the office, to further his workup, I obtained an AP and lateral radiograph of the lumbar spine that demonstrates that he has approximately 4 mm of anterolisthesis of L4 and L5 on the flexion view, and this reduces partially on the extension view.  The overall alignment of his spine, however, demonstrates that he has a fairly well-preserved lordosis in the lower lumbar spine, and the AP view  demonstrates good anatomic alignment in that regard.  All the disc heights, except for L4-L5, are well preserved and maintained.  Patient notes that he gets some radicular pain that radiates into the buttocks and the backs of the thighs.  The bulk of the pain centers around the low back.  It seems that all the injections, the passage of time, physical therapy have not yielded much improvement, and he is seen now for another opinion as to how to handle this process.  PAST MEDICAL HISTORY: Reveals that he has otherwise been healthy, save for the recent history of the onset of diabetes.  CURRENT MEDICATIONS: Include Metformin.  He also uses some Buspirone, Flonase, and Zyrtec.  PHYSICAL EXAM: I note that he stands straight and erect.  He walks without an antalgia and demonstrates good strength in the major groups of his lower extremities.  He does get up in a slow and cautious position and does tend to maintain a slight forward stoop when he first arises.  IMPRESSION: The patient has evidence of a spondylolisthesis at L4-L5 with a central disc protrusion.  There is no overt nerve root compromise, and with the flexion-extension films the movement at the spondylolisthesis is truly fairly minimal.  Nonetheless, his pain syndrome has been substantial and going on now for nearly a year and a half.   We have discussed and performed a lidocaine Marcaine block of the disc space at L4-L5 which  nearly completely relieved his pain for upwards of 12 hours.  Patient was quite pleased with the degree of pain relief that he had for that brief time and after careful consideration we discussed surgical decompression of L4-L5 using an anterior interbody technique.  He is now admitted for that procedure.

## 2020-12-28 NOTE — Progress Notes (Signed)
Patient ID: Clinton Horton, male   DOB: 15-Sep-1980, 40 y.o.   MRN: [id number] Patient is awake alert and ambulating.  He has voided spontaneously.  Pain Reasonable control.  Incision is clean and dry.  Motor function is intact.

## 2020-12-28 NOTE — Interval H&P Note (Signed)
History and Physical Interval Note:  12/28/2020 7:14 AM  Clinton Horton  has presented today for surgery, with the diagnosis of Spondylolisthesis, Lumbar.  The various methods of treatment have been discussed with the patient and family. After consideration of risks, benefits and other options for treatment, the patient has consented to  Procedure(s) with comments: Lumbar 4-5 Anterior lumbar interbody fusion (N/A) - 3C/RM 20 ABDOMINAL EXPOSURE (N/A) as a surgical intervention.  The patient's history has been reviewed, patient examined, no change in status, stable for surgery.  I have reviewed the patient's chart and labs.  Questions were answered to the patient's satisfaction.     [name]

## 2020-12-28 NOTE — Op Note (Signed)
Date: 12/28/2020 Preoperative diagnosis: Spondylolisthesis L4-L5 with back pain and radiculopathy Postoperative diagnosis: Same Procedure: Anterior lumbar interbody arthrodesis with titanium spacer allograft and Proteus. Surgeon: Barnett Abu Approach: Woodfin Ganja, MD Anesthesia: General endotracheal Indications: Clinton Horton is a 40 year old individual whose had a degenerative spondylolisthesis that became symptomatic after work-related incident.  It is possible that he had a traumatic spondylolisthesis created at this time.  The patient has had chronic back pain for nearly 2 years time and has failed all efforts at conservative management.  Discography with Marcaine yielded relief for period of about 12 hours and after careful consideration of his options we discussed anterior interbody arthrodesis to decompress and stabilize the L4-5 joint.  Procedure patient was brought to the operating room supine on the stretcher.  After the smooth induction of general endotracheal anesthesia and localizing the L4-L5 space on the ventral aspect of his abdomen the area was prepped with alcohol DuraPrep and draped in a sterile fashion.  Dr. Woodfin Ganja performed the approach to the retroperitoneal space and secured and identified the L4-5 anterior longitudinal ligament and disc space by placing appropriate retractors to expose this region.  I then opened the anterior longitudinal ligament at L4-L5 and using a Cobb dissector I was able to loosen and remove a large portion of the disc at L4-L5.  The endplates were then curettaged until the region of the posterior longitudinal ligament was opened.  The ligament itself was left intact however the disc was removed from the subligamentous space and a decompression of the disc space was completed using a combination of curettes and rongeurs to clean disc from the lateral aspects of the interspace.  Then a high-speed drill with a 3 mm dissecting tool was used to shave the  endplates smooth and to make sure that there was good bony contact with removal of all remnants of the cartilage endplates.  The interspace was then sized and it was felt that an 8 x 38 x 28 mm spacer with 15 degrees of lordosis would fit best into this interval.  This was packed with a combination of allograft mixed with Proteus and the smallest aliquot available of 1 cc.  This was packed into the interval and then secured with four 5 x 20 mm bolts.  Final radiograph was obtained which showed good positioning of the interbody spacer and good alignment with a good restoration of lordosis at the L4-L5 level.  Additional bone graft was packed around the outside of the device and this was supplemented with some demineralized bone matrix.  The retractors were then carefully removed and hemostasis was checked in the retroperitoneal space when this was verified the anterior rectus sheath was closed with a #1 running PDS suture 2-0 Vicryl was used in the subcutaneous tissues 3-0 Vicryl in the superficial subcuticular layer and the final subcuticular closure was with 4-0 Vicryl interrupted.  Dermabond was placed on the skin and blood loss was estimated less than 50 cc.  Patient tolerated procedure well is returned to recovery room in stable condition.

## 2020-12-28 NOTE — Op Note (Signed)
    NAME: Clinton Horton    MRN: [id number] DOB: Jan 18, 1981    DATE OF OPERATION: 12/28/2020  PREOP DIAGNOSIS:    Degenerative disc disease L4-L5  POSTOP DIAGNOSIS:    Same  PROCEDURE:    Anterior retroperitoneal exposure L4-L5  EXPOSURE SURGEON: Di Kindle. Edilia Bo, MD  SPINE SURGEON: Barnett Abu, MD  ANESTHESIA: General  EBL: Minimal  INDICATIONS:    [name] is a 40 y.o. male who presented with significant degenerative disc disease L4-L5.  He was felt to be a good candidate for anterior lumbar interbody fusion.   FINDINGS:   No significant disease in the common and external iliac artery on the left.  TECHNIQUE:   The patient was taken to the operating room after monitoring lines were placed by anesthesia.  Pulse oximetry was placed on the left foot.  The level of the L4-L5 disc base was marked under fluoroscopy.  The abdomen was prepped and draped in usual sterile fashion.  An incision was made along the marked line and the dissection carried down to the anterior rectus sheath.  The anterior rectus sheath was divided transversely.  The anterior rectus sheath was mobilized superiorly and inferiorly.  I then dissected the lateral aspect of the rectus abdominis muscle to expose the transversalis fascia.  The transversalis fascia was divided with a 15 blade.  A retroperitoneal dissection was begun and the dissection carried down to the psoas muscle and then onto the external and common iliac artery.  Using blunt dissection these were mobilized proximally and distally thus exposing the underlying iliac vein and iliolumbar vein.  The iliolumbar vein was divided between 2-0 silk ties and clips.  This was divided.  This allowed mobilization of the iliac vein and arterial structures to the patient's right.  The L4-L5 disc base was exposed.  The Thompson retractor system was placed to maintain exposure.  Correct position was confirmed with fluoroscopy and fluoroscopy.  The  remainder of the dictation is as per Dr. Danielle Dess.     [name], MD, FACS [facility]  DATE OF DICTATION:   12/28/2020

## 2020-12-28 NOTE — Transfer of Care (Signed)
Immediate Anesthesia Transfer of Care Note  Patient: Clinton Horton  Procedure(s) Performed: Lumbar four-five  Anterior lumbar interbody fusion (N/A ) ABDOMINAL EXPOSURE (N/A )  Patient Location: PACU  Anesthesia Type:General  Level of Consciousness: awake, alert  and oriented  Airway & Oxygen Therapy: Patient connected to face mask oxygen  Post-op Assessment: Post -op Vital signs reviewed and stable  Post vital signs: stable  Last Vitals:  Vitals Value Taken Time  BP 134/87 12/28/20 1046  Temp    Pulse 92 12/28/20 1046  Resp 17 12/28/20 1046  SpO2 99 % 12/28/20 1046  Vitals shown include unvalidated device data.  Last Pain:  Vitals:   12/28/20 0631  TempSrc:   PainSc: 5       Patients Stated Pain Goal: 2 (12/28/20 0631)  Complications: No complications documented.

## 2020-12-28 NOTE — Progress Notes (Signed)
Orthopedic Tech Progress Note Patient Details:  Clinton Horton [birth year] [id number]  Ortho Devices Type of Ortho Device: Lumbar corsett Ortho Device/Splint Location: BACK Ortho Device/Splint Interventions: Adjustment,Ordered   Post Interventions Patient Tolerated: Well Instructions Provided: Care of device   Donald Pore 12/28/2020, 11:25 AM

## 2020-12-28 NOTE — Anesthesia Procedure Notes (Signed)
Arterial Line Insertion Start/End4/19/2022 7:15 AM, 12/28/2020 7:20 AM Performed by: Dorie Rank, CRNA  Preanesthetic checklist: patient identified, IV checked, risks and benefits discussed, surgical consent, pre-op evaluation and timeout performed Lidocaine 1% used for infiltration Left, radial was placed Catheter size: 20 G Hand hygiene performed , maximum sterile barriers used  and Seldinger technique used Allen's test indicative of satisfactory collateral circulation Attempts: 1 Procedure performed without using ultrasound guided technique. Following insertion, Biopatch and dressing applied. Post procedure assessment: normal  Patient tolerated the procedure well with no immediate complications.

## 2020-12-28 NOTE — Anesthesia Postprocedure Evaluation (Signed)
Anesthesia Post Note  Patient: Clinton Horton  Procedure(s) Performed: Lumbar four-five  Anterior lumbar interbody fusion (N/A ) ABDOMINAL EXPOSURE (N/A )     Patient location during evaluation: PACU Anesthesia Type: General Level of consciousness: awake Pain management: pain level controlled Vital Signs Assessment: post-procedure vital signs reviewed and stable Respiratory status: spontaneous breathing Cardiovascular status: stable Postop Assessment: no apparent nausea or vomiting Anesthetic complications: no   No complications documented.  Last Vitals:  Vitals:   12/28/20 1146 12/28/20 1219  BP: 127/80 (!) 141/81  Pulse:  79  Resp: 16 17  Temp: (!) 36.1 C (!) 36.4 C  SpO2: 100% 100%    Last Pain:  Vitals:   12/28/20 1334  TempSrc:   PainSc: 7                  [name]

## 2020-12-29 ENCOUNTER — Encounter (HOSPITAL_COMMUNITY): Payer: Self-pay | Admitting: Neurological Surgery

## 2020-12-29 LAB — GLUCOSE, CAPILLARY: Glucose-Capillary: 133 mg/dL — ABNORMAL HIGH (ref 70–99)

## 2020-12-29 MED ORDER — METHOCARBAMOL 500 MG PO TABS: 500.0000 mg | ORAL_TABLET | Freq: Four times a day (QID) | ORAL | 2 refills | Status: AC | PRN

## 2020-12-29 MED ORDER — OXYCODONE-ACETAMINOPHEN 5-325 MG PO TABS: 1.0000 | ORAL_TABLET | ORAL | 0 refills | Status: AC | PRN

## 2020-12-29 MED FILL — Sodium Chloride IV Soln 0.9%: INTRAVENOUS | Qty: 1000 | Status: AC

## 2020-12-29 MED FILL — Heparin Sodium (Porcine) Inj 1000 Unit/ML: INTRAMUSCULAR | Qty: 30 | Status: AC

## 2020-12-29 NOTE — Evaluation (Signed)
Physical Therapy Evaluation Patient Details Name: Clinton Horton MRN: [id number] DOB: Apr 08, 1981 Today's Date: 12/29/2020   History of Present Illness  The pt is a 40 yo male presenting for ALIF of L4-5 due to ongoing back pain following crush injury by a forklift in 2020. PMH includes: obesity, tobacco use, anxiety, and sciatica.    Clinical Impression  Pt in bed upon arrival of PT, agreeable to evaluation at this time. Prior to admission the pt was independent with mobility, but limited by pain with continued activity. The pt now presents with minor limitations in functional mobility and activity tolerance due to above dx and resulting pain, but is safe to return home when medically cleared. The pt was able to demo good independence with all bed mobility, transfers, hallway ambulation and stairs without need for support or assist and without use of AD. All education completed regarding spinal precautions and fall reduction at home following surgery. Pt with no further questions or acute PT needs at this time. Thank you for the consult.      Follow Up Recommendations No PT follow up    Equipment Recommendations  None recommended by PT    Recommendations for Other Services       Precautions / Restrictions Precautions Precautions: Back Precaution Booklet Issued: Yes (comment) Precaution Comments: discussed, pt able to recall 3/3 Required Braces or Orthoses: Spinal Brace Spinal Brace: Lumbar corset;Applied in sitting position Restrictions Weight Bearing Restrictions: No      Mobility  Bed Mobility Overal bed mobility: Independent             General bed mobility comments: pt able to complete log roll without assistance or cues from flat bed without bed rails    Transfers Overall transfer level: Independent Equipment used: None             General transfer comment: pt completed multiple times within session without issue or  instability  Ambulation/Gait Ambulation/Gait assistance: Independent Gait Distance (Feet): 450 Feet Assistive device: None Gait Pattern/deviations: Step-through pattern Gait velocity: 0.56 m/g Gait velocity interpretation: 1.31 - 2.62 ft/sec, indicative of limited community ambulator General Gait Details: pt with slow but steady speed, able to manage without support or evidence of instability  Stairs Stairs: Yes Stairs assistance: Supervision Stair Management: No rails;Alternating pattern;Step to pattern Number of Stairs: 6 General stair comments: limited by pain  Wheelchair Mobility    Modified Rankin (Stroke Patients Only)       Balance Overall balance assessment: No apparent balance deficits (not formally assessed)                                           Pertinent Vitals/Pain Pain Assessment: 0-10 Pain Score: 6  Pain Location: surgical site, L hip Pain Descriptors / Indicators: Discomfort;Grimacing;Sore Pain Intervention(s): Limited activity within patient's tolerance;Monitored during session;Repositioned    Home Living Family/patient expects to be discharged to:: Private residence Living Arrangements: Alone Available Help at Discharge: Family;Friend(s);Available PRN/intermittently Type of Home: House Home Access: Ramped entrance     Home Layout: One level Home Equipment: Grab bars - tub/shower;Shower seat      Prior Function Level of Independence: Independent         Comments: limited by onset of pain     Hand Dominance        Extremity/Trunk Assessment   Upper Extremity Assessment Upper Extremity Assessment: Overall [facility]  for tasks assessed    Lower Extremity Assessment Lower Extremity Assessment: Overall WFL for tasks assessed (pt denies difference in sensation, slight difference in MMT as LLE limited more by pain than RLE)    Cervical / Trunk Assessment Cervical / Trunk Assessment: Other exceptions Cervical / Trunk  Exceptions: s/p spine surgery  Communication   Communication: No difficulties  Cognition Arousal/Alertness: Awake/alert Behavior During Therapy: WFL for tasks assessed/performed Overall Cognitive Status: Within Functional Limits for tasks assessed                                        General Comments      Exercises     Assessment/Plan    PT Assessment Patent does not need any further PT services  PT Problem List         PT Treatment Interventions      PT Goals (Current goals can be found in the Care Plan section)  Acute Rehab PT Goals Patient Stated Goal: return home, reduce LLE pain PT Goal Formulation: With patient Time For Goal Achievement: 01/12/21 Potential to Achieve Goals: Good    Frequency     Barriers to discharge        Co-evaluation               AM-PAC PT "6 Clicks" Mobility  Outcome Measure Help needed turning from your back to your side while in a flat bed without using bedrails?: None Help needed moving from lying on your back to sitting on the side of a flat bed without using bedrails?: None Help needed moving to and from a bed to a chair (including a wheelchair)?: None Help needed standing up from a chair using your arms (e.g., wheelchair or bedside chair)?: None Help needed to walk in hospital room?: None Help needed climbing 3-5 steps with a railing? : None 6 Click Score: 24    End of Session Equipment Utilized During Treatment: Gait belt;Back brace Activity Tolerance: Patient tolerated treatment well Patient left: Other (comment) (walking in hallway) Nurse Communication: Mobility status PT Visit Diagnosis: Pain Pain - part of body:  (back)    Time: 5035-4656 PT Time Calculation (min) (ACUTE ONLY): 15 min   Charges:   PT Evaluation $PT Eval Low Complexity: 1 Low          Rolm Baptise, PT, DPT   Acute Rehabilitation Department Pager #: [phone]  Gaetana Michaelis 12/29/2020, 8:19 AM

## 2020-12-29 NOTE — Evaluation (Signed)
Occupational Therapy Evaluation Patient Details Name: Clinton Horton MRN: [id number] DOB: Jul 17, 1981 Today's Date: 12/29/2020    History of Present Illness The pt is a 40 yo male presenting for ALIF of L4-5 due to ongoing back pain following crush injury by a forklift in 2020. PMH includes: obesity, tobacco use, anxiety, and sciatica.   Clinical Impression   Patient admitted for the diagnosis and procedure above.  Patient has expected post surgical discomfort, but is not needing any assist with bathing or dressing.  He will have assist as needed, and no other OT needs identified.      Follow Up Recommendations  No OT follow up    Equipment Recommendations  None recommended by OT    Recommendations for Other Services       Precautions / Restrictions Precautions Precautions: Back Precaution Booklet Issued: Yes (comment) Precaution Comments: discussed, pt able to recall 3/3 Required Braces or Orthoses: Spinal Brace Spinal Brace: Lumbar corset;Applied in sitting position Restrictions Weight Bearing Restrictions: No      Mobility Bed Mobility Overal bed mobility: Modified Independent             General bed mobility comments: pt able to complete log roll without assistance or cues from flat bed without bed rails    Transfers Overall transfer level: Independent Equipment used: None             General transfer comment: pt completed multiple times within session without issue or instability    Balance Overall balance assessment: No apparent balance deficits (not formally assessed)                                         ADL either performed or assessed with clinical judgement   ADL Overall ADL's : Modified independent                                             Vision Baseline Vision/History: No visual deficits       Perception     Praxis      Pertinent Vitals/Pain Pain Assessment: Faces Pain Score: 6  Faces  Pain Scale: Hurts little more Pain Location: surgical site, L hip Pain Descriptors / Indicators: Discomfort;Grimacing;Sore Pain Intervention(s): Monitored during session     Hand Dominance     Extremity/Trunk Assessment Upper Extremity Assessment Upper Extremity Assessment: Overall WFL for tasks assessed   Lower Extremity Assessment Lower Extremity Assessment: Defer to PT evaluation   Cervical / Trunk Assessment Cervical / Trunk Assessment: Other exceptions Cervical / Trunk Exceptions: s/p spine surgery   Communication Communication Communication: No difficulties   Cognition Arousal/Alertness: Awake/alert Behavior During Therapy: WFL for tasks assessed/performed Overall Cognitive Status: Within Functional Limits for tasks assessed                                                      Home Living Family/patient expects to be discharged to:: Private residence Living Arrangements: Alone Available Help at Discharge: Family;Friend(s);Available PRN/intermittently Type of Home: House Home Access: Ramped entrance     Home Layout: One level     Bathroom Shower/Tub: Walk-in shower  Bathroom Toilet: Standard     Home Equipment: Grab bars - tub/shower;Shower seat          Prior Functioning/Environment Level of Independence: Independent        Comments: limited by onset of pain        OT Problem List: Pain      OT Treatment/Interventions:      OT Goals(Current goals can be found in the care plan section) Acute Rehab OT Goals Patient Stated Goal: to go home OT Goal Formulation: With patient Potential to Achieve Goals: Good  OT Frequency:     Barriers to D/C:  none noted          Co-evaluation              AM-PAC OT "6 Clicks" Daily Activity     Outcome Measure Help from another person eating meals?: None Help from another person taking care of personal grooming?: None Help from another person toileting, which includes using  toliet, bedpan, or urinal?: None Help from another person bathing (including washing, rinsing, drying)?: None Help from another person to put on and taking off regular upper body clothing?: None Help from another person to put on and taking off regular lower body clothing?: None 6 Click Score: 24   End of Session Equipment Utilized During Treatment: Back brace Nurse Communication: Mobility status  Activity Tolerance: Patient tolerated treatment well Patient left: in chair  OT Visit Diagnosis: Pain                Time: 0811-0830 OT Time Calculation (min): 19 min Charges:  OT General Charges $OT Visit: 1 Visit OT Evaluation $OT Eval Moderate Complexity: 1 Mod  12/29/2020  Rich, OTR/L  Acute Rehabilitation Services  Office:  [phone]   Suzanna Obey 12/29/2020, 9:04 AM

## 2020-12-29 NOTE — Progress Notes (Signed)
   VASCULAR SURGERY ASSESSMENT & PLAN:   POD 1 ANTERIOR RETROPERITONEAL EXPOSURE L4- L5: Doing well.  He has been ambulating in the halls.  Palpable left pedal pulses.  His incision looks fine.  Vascular surgery will be available as needed.   SUBJECTIVE:   No complaints.  PHYSICAL EXAM:   Vitals:   12/28/20 1922 12/28/20 2311 12/29/20 0354 12/29/20 0713  BP: (!) 142/89 123/83 105/62 (!) 143/87  Pulse: 91 78 85 84  Resp: 20 20 20 18   Temp: 98.6 F (37 C) 98.2 F (36.8 C) 98.1 F (36.7 C) 98 F (36.7 C)  TempSrc: Oral Oral Oral Oral  SpO2: 99% 98% 97% 99%  Weight:      Height:       Palpable left posterior tibial and dorsalis pedis pulse. His incision looks fine.  LABS:   CBG (last 3)  Recent Labs    12/28/20 1646 12/28/20 2130 12/29/20 0612  GLUCAP 149* 106* 133*    PROBLEM LIST:    Active Problems:   Spondylolisthesis at L4-L5 level   CURRENT MEDS:   . docusate sodium  100 mg Oral BID  . insulin aspart  0-15 Units Subcutaneous TID WC  . insulin aspart  0-5 Units Subcutaneous QHS  . metFORMIN  500 mg Oral BID WC  . multivitamin with minerals  1 tablet Oral Daily  . senna  1 tablet Oral BID  . sodium chloride flush  3 mL Intravenous Q12H    12/31/20 Office: [phone] 12/29/2020

## 2020-12-29 NOTE — Discharge Summary (Signed)
Physician Discharge Summary  Patient ID: Clinton Horton MRN: [id number] DOB/AGE: Aug 18, 1981 40 y.o.  Admit date: 12/28/2020 Discharge date: 12/29/2020  Admission Diagnoses: Spondylolisthesis L4-L5 with lumbar radiculopathy  Discharge Diagnoses: Spondylolisthesis L4-L5 with lumbar radiculopathy Active Problems:   Spondylolisthesis at L4-L5 level   Discharged Condition: good  Hospital Course: Patient was admitted to undergo anterior lumbar interbody decompression arthrodesis which he tolerated well.  Consults: vascular surgery  Significant Diagnostic Studies: None  Treatments: surgery: See op note  Discharge Exam: Blood pressure (!) 143/87, pulse 84, temperature 98 F (36.7 C), temperature source Oral, resp. rate 18, height 5\' 8"  (1.727 m), weight 111.8 kg, SpO2 99 %. Incision clean and dry motor function is intact  Disposition: Discharge disposition: 01-Home or Self Care       Discharge Instructions    Call MD for:  redness, tenderness, or signs of infection (pain, swelling, redness, odor or green/yellow discharge around incision site)   Complete by: As directed    Call MD for:  severe uncontrolled pain   Complete by: As directed    Call MD for:  temperature >100.4   Complete by: As directed    Diet - low sodium heart healthy   Complete by: As directed    Discharge wound care:   Complete by: As directed    Okay to shower. Do not apply salves or appointments to incision. No heavy lifting with the upper extremities greater than 10 pounds. May resume driving when not requiring pain medication and patient feels comfortable with doing so.   Incentive spirometry RT   Complete by: As directed    Increase activity slowly   Complete by: As directed      Allergies as of 12/29/2020      Reactions   Other    Steroids cause elevated Blood Sugar      Medication List    TAKE these medications   metFORMIN 500 MG tablet Commonly known as: GLUCOPHAGE Take 500 mg by mouth 2  (two) times daily with a meal.   methocarbamol 500 MG tablet Commonly known as: ROBAXIN Take 1 tablet (500 mg total) by mouth every 6 (six) hours as needed for muscle spasms.   multivitamin with minerals Tabs tablet Take 1 tablet by mouth daily.   oxyCODONE-acetaminophen 5-325 MG tablet Commonly known as: PERCOCET/ROXICET Take 1 tablet by mouth every 4 (four) hours as needed for severe pain or moderate pain.            Discharge Care Instructions  (From admission, onward)         Start     Ordered   12/29/20 0000  Discharge wound care:       Comments: Okay to shower. Do not apply salves or appointments to incision. No heavy lifting with the upper extremities greater than 10 pounds. May resume driving when not requiring pain medication and patient feels comfortable with doing so.   12/29/20 12/31/20          Follow-up Information    6283, MD Follow up.   Specialty: Neurosurgery Contact information: [address] [place] [phone]               Signed: [phone] 12/29/2020, 9:28 AM

## 2020-12-29 NOTE — Progress Notes (Signed)
NURSING PROGRESS NOTE  Clinton Horton [id number] Discharge Data: 12/29/2020 10:02 AM Attending Provider: Barnett Abu, MD PCP:Pcp, No     Clinton Horton to be D/C'd Home per MD order.  Discussed with the patient the After Visit Summary and all questions fully answered. Patient was given 2 percocet prior to  D/C due to long ride home. All IV's discontinued with no bleeding noted. All belongings returned to patient for patient to take home.   Last Vital Signs:  Blood pressure (!) 143/87, pulse 84, temperature 98 F (36.7 C), temperature source Oral, resp. rate 18, height 5\' 8"  (1.727 m), weight 111.8 kg, SpO2 99 %.  Discharge Medication List Allergies as of 12/29/2020      Reactions   Other    Steroids cause elevated Blood Sugar      Medication List    TAKE these medications   metFORMIN 500 MG tablet Commonly known as: GLUCOPHAGE Take 500 mg by mouth 2 (two) times daily with a meal.   methocarbamol 500 MG tablet Commonly known as: ROBAXIN Take 1 tablet (500 mg total) by mouth every 6 (six) hours as needed for muscle spasms.   multivitamin with minerals Tabs tablet Take 1 tablet by mouth daily.   oxyCODONE-acetaminophen 5-325 MG tablet Commonly known as: PERCOCET/ROXICET Take 1 tablet by mouth every 4 (four) hours as needed for severe pain or moderate pain.            Discharge Care Instructions  (From admission, onward)         Start     Ordered   12/29/20 0000  Discharge wound care:       Comments: Okay to shower. Do not apply salves or appointments to incision. No heavy lifting with the upper extremities greater than 10 pounds. May resume driving when not requiring pain medication and patient feels comfortable with doing so.   12/29/20 12/31/20

## 2020-12-29 NOTE — Discharge Instructions (Signed)
Wound Care Leave incision open to air. You may shower. Do not scrub directly on incision.  Do not put any creams, lotions, or ointments on incision. Activity Walk each and every day, increasing distance each day. No lifting greater than 5 lbs.  Avoid bending, arching, and twisting. No driving for 2 weeks; may ride as a passenger locally. If provided with back brace, wear when out of bed.  It is not necessary to wear in bed. Diet Resume your normal diet.  Return to Work Will be discussed at you follow up appointment. Call Your Doctor If Any of These Occur Redness, drainage, or swelling at the wound.  Temperature greater than 101 degrees. Severe pain not relieved by pain medication. Incision starts to come apart. Follow Up Appt Call today for appointment in 2-3 weeks ([phone]) or for problems.  If you have any hardware placed in your spine, you will need an x-ray before your appointment. Patient Discharge   Clinton Horton / [id number] DOB: 1980-10-06   Admitted 12/28/2020 Discharged: 12/29/2020   Activity Instructions   You must avoid lifting more than *** pounds until your physician instructs you differently. You should avoid {d/c avoid/resume:120111}. You may resume {d/c avoid/resume:120111}.   I understand that if any problems occur once I am at home I am to contact my physician.  I understand and acknowledge receipt of the instructions indicated above.    _____________________________________________                                                       Physician's or R.N.'s Signature                Date/Time                        _____________________________________________                                                       Patient or Representative Signature         Date/Time

## 2022-04-01 IMAGING — CR DG OR LOCAL ABDOMEN
1 series · 1 of 1 positions shown · non-contrast
Comparison: None.

CLINICAL DATA: Status post anterior fusion of L4-5.

EXAM:
OR LOCAL ABDOMEN

[AP]
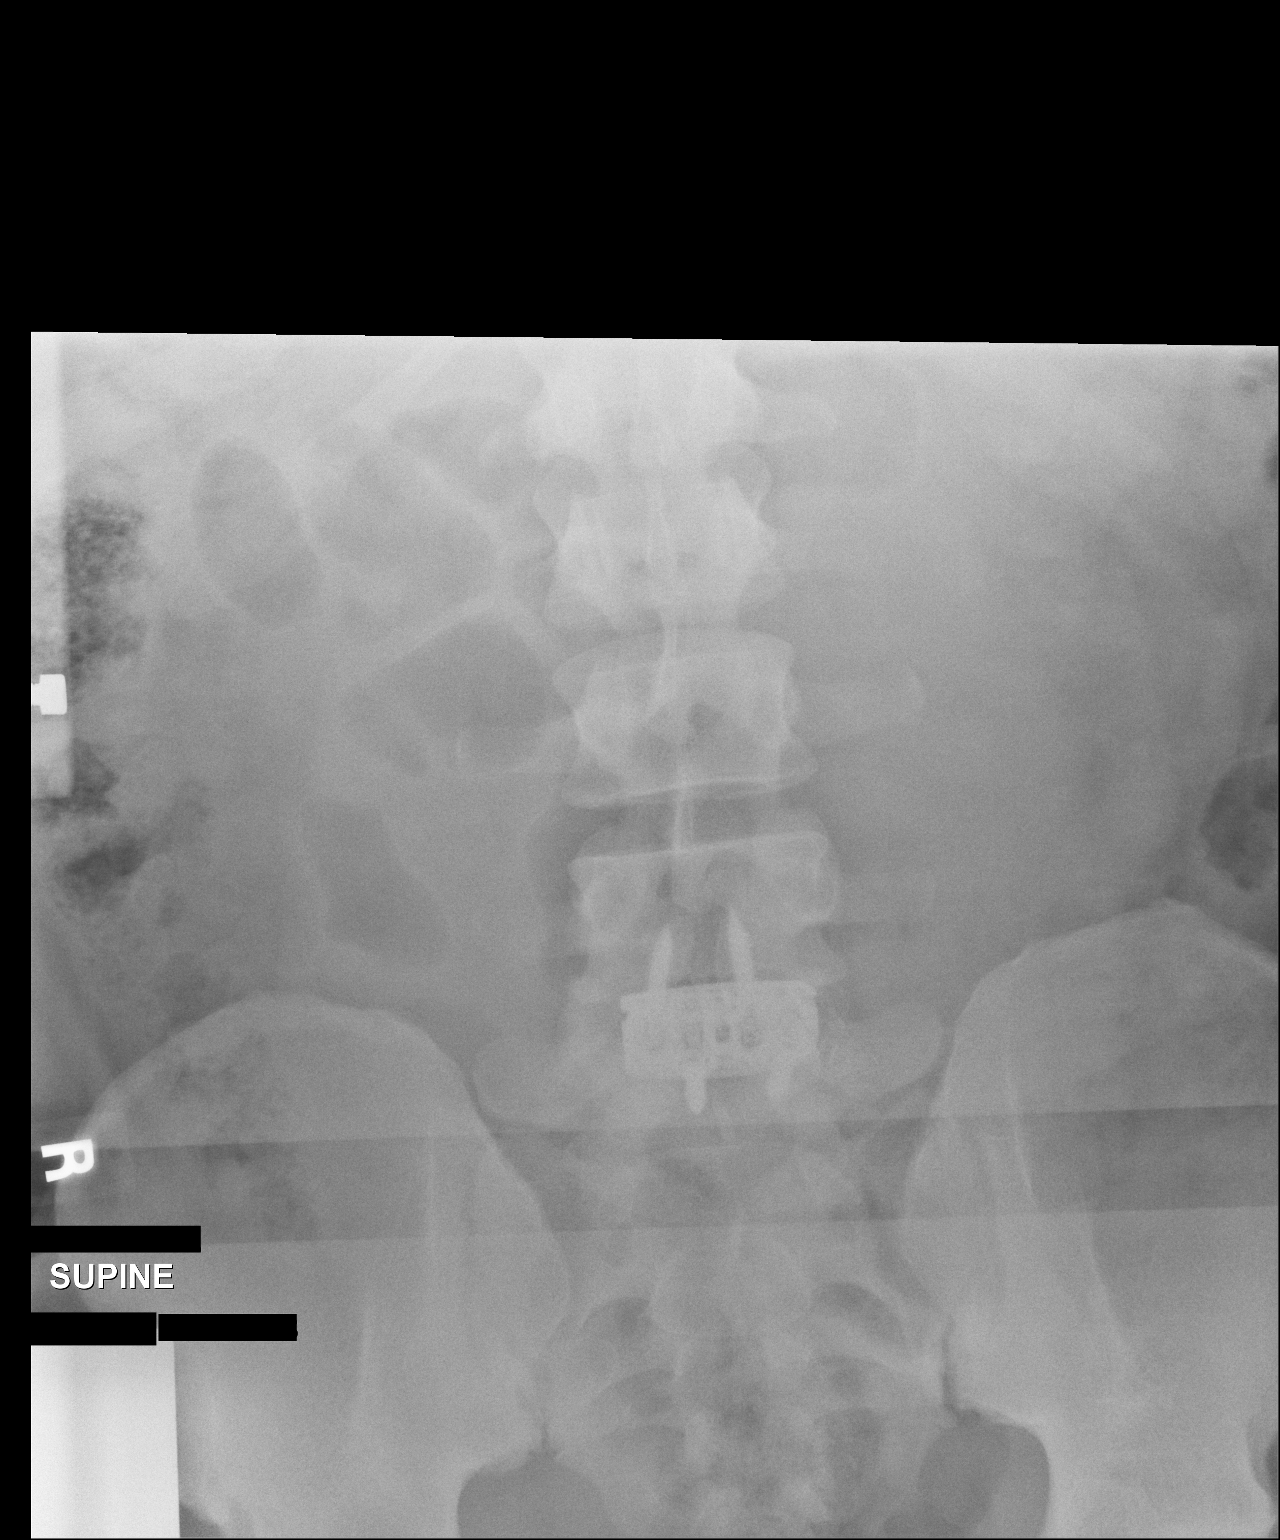

[1 of 1 positions shown; findings below may reference images not displayed]

FINDINGS: The bowel gas pattern is normal. No radio-opaque calculi or other
significant radiographic abnormality are seen. Status post surgical
anterior fusion of L4-5. No other radiopaque foreign body is noted.
IMPRESSION: Status post surgical anterior fusion of L4-5. No other radiopaque
foreign body is noted. These results were called by telephone at the
time of interpretation on 12/28/2020 at [DATE] to provider Garey
Blain, who verbally acknowledged these results.

## 2022-04-01 IMAGING — RF DG C-ARM 1-60 MIN
1 series · 2 of 2 positions shown · non-contrast
Comparison: Radiographs 10/08/2020

CLINICAL DATA: Anterior laminectomy in internal fusion at L4-5

EXAM:
LUMBAR SPINE - 2-3 VIEW; DG C-ARM 1-60 MIN

[Series 1: run · 2 of 2 slices shown]
[im 1/2]
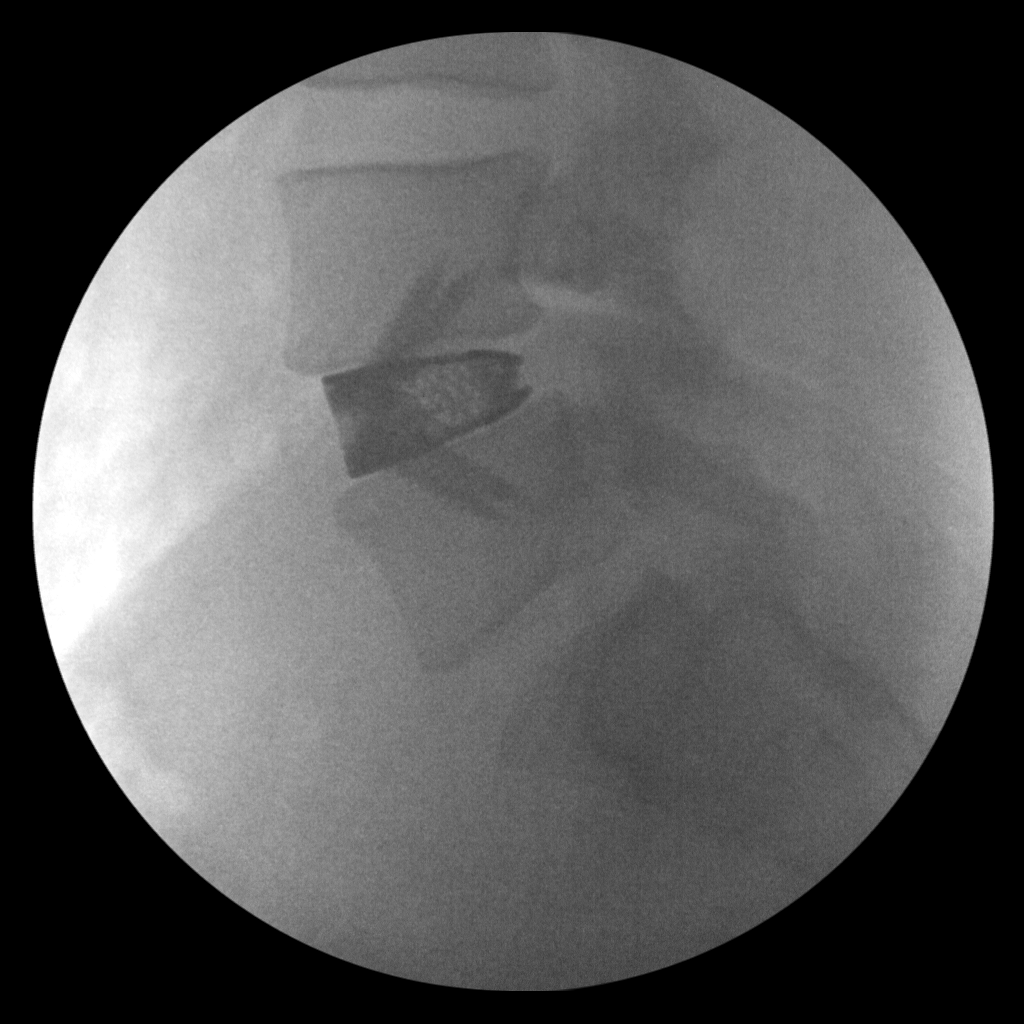
[im 2/2]
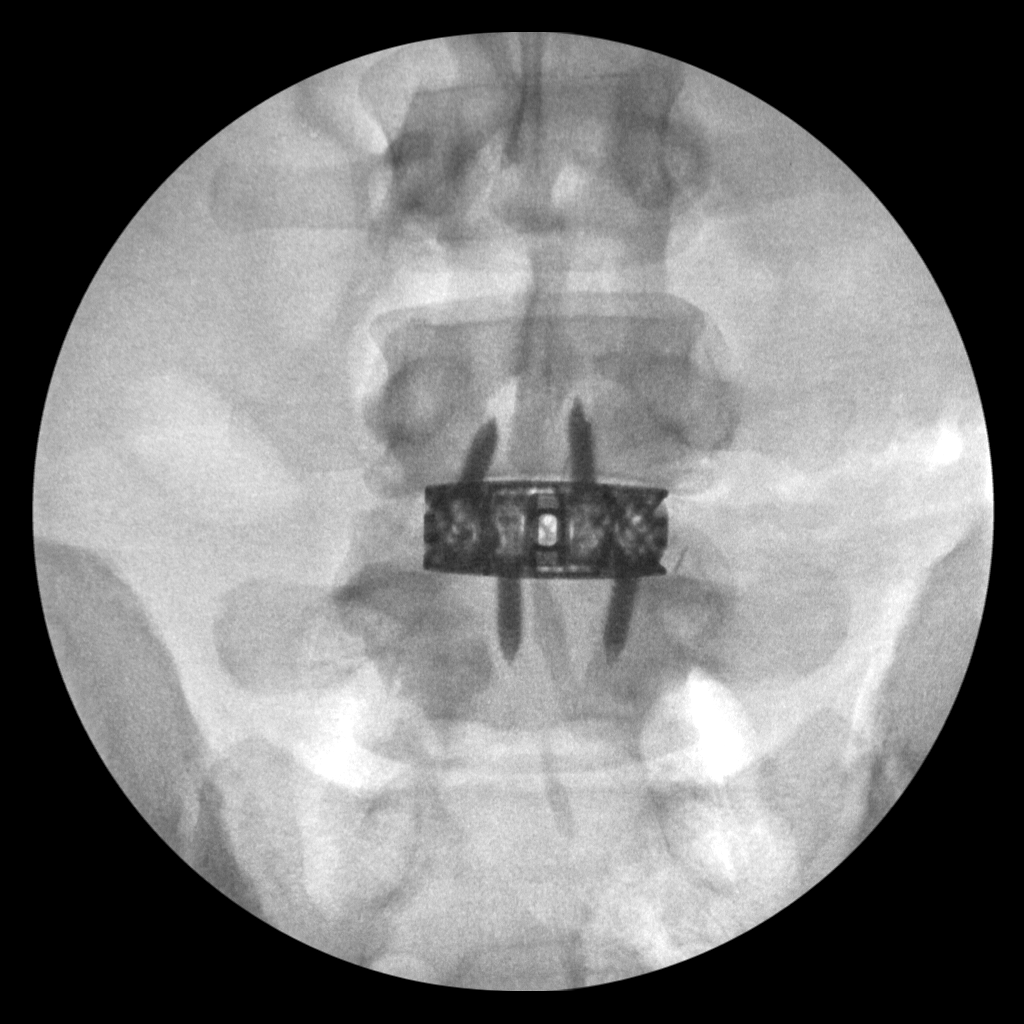

[2 of 2 positions shown; findings below may reference images not displayed]

FINDINGS: Interbody spacer with two L4 and two L5 screws noted with good
alignment at L4-5. No complicating feature is observed.
IMPRESSION: 1. L4-5 ALIF with good positioning of the interbody spacer and no
complicating feature identified on the 2 fluoroscopic spot images.

## 2022-04-01 IMAGING — RF DG LUMBAR SPINE 2-3V
1 series · 2 of 2 positions shown · non-contrast
Comparison: Radiographs 10/08/2020

CLINICAL DATA: Anterior laminectomy in internal fusion at L4-5

EXAM:
LUMBAR SPINE - 2-3 VIEW; DG C-ARM 1-60 MIN

[Series 1: run · 2 of 2 slices shown]
[im 1/2]
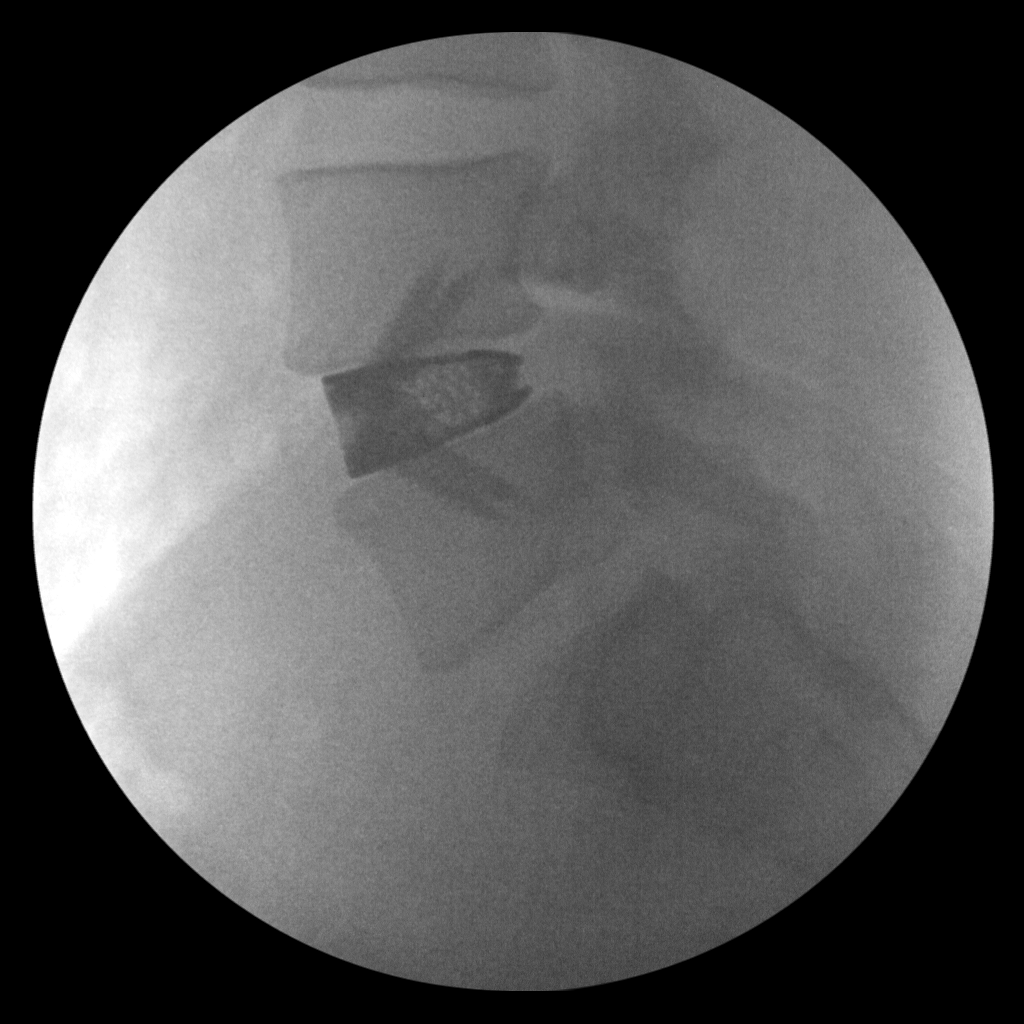
[im 2/2]
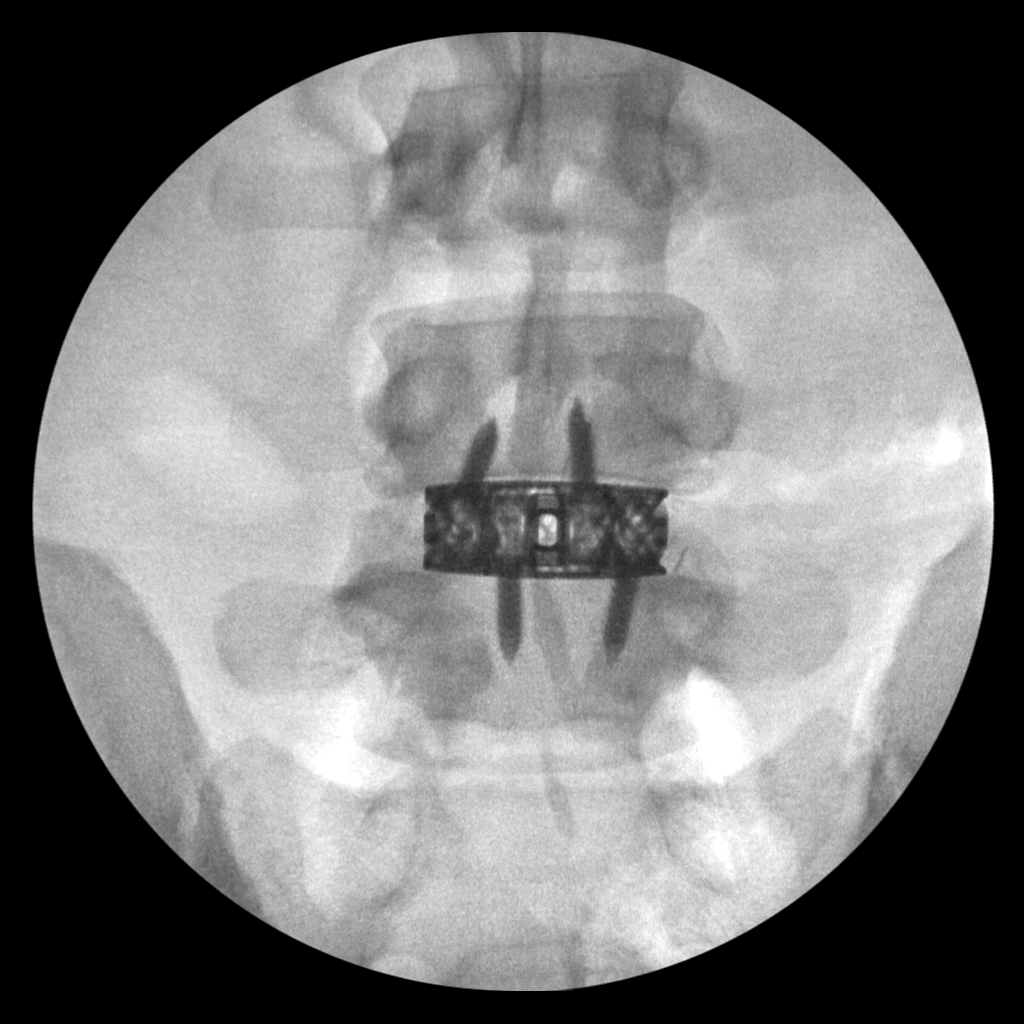

[2 of 2 positions shown; findings below may reference images not displayed]

FINDINGS: Interbody spacer with two L4 and two L5 screws noted with good
alignment at L4-5. No complicating feature is observed.
IMPRESSION: 1. L4-5 ALIF with good positioning of the interbody spacer and no
complicating feature identified on the 2 fluoroscopic spot images.
